# Patient Record
Sex: Male | Born: 1990 | Race: White | Hispanic: No | Marital: Single | Smoking: Former smoker
Health system: Southern US, Community
[De-identification: ages and names within clinical notes are randomized; demographics above are authoritative.]

## PROBLEM LIST (undated history)

## (undated) DIAGNOSIS — I2699 Other pulmonary embolism without acute cor pulmonale: Secondary | ICD-10-CM

## (undated) DIAGNOSIS — F988 Other specified behavioral and emotional disorders with onset usually occurring in childhood and adolescence: Secondary | ICD-10-CM

## (undated) HISTORY — PX: PULMONARY EMBOLISM SURGERY: SHX752

---
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
## 2009-05-07 ENCOUNTER — Emergency Department: Payer: Self-pay | Admitting: Emergency Medicine

## 2011-10-05 ENCOUNTER — Ambulatory Visit: Payer: Self-pay | Admitting: Family Medicine

## 2011-10-05 LAB — LIPID PANEL
Cholesterol: 84 mg/dL (ref 0–200)
Ldl Cholesterol, Calc: 23 mg/dL (ref 0–100)
Triglycerides: 132 mg/dL (ref 0–200)

## 2011-10-05 LAB — CBC WITH DIFFERENTIAL/PLATELET
Basophil %: 0.8 %
Eosinophil %: 3.2 %
HCT: 41.5 % (ref 40.0–52.0)
HGB: 14.4 g/dL (ref 13.0–18.0)
Lymphocyte %: 26.3 %
MCH: 30.5 pg (ref 26.0–34.0)
MCHC: 34.6 g/dL (ref 32.0–36.0)
MCV: 88 fL (ref 80–100)
Monocyte #: 0.7 x10 3/mm (ref 0.2–1.0)
Neutrophil %: 61.4 %
Platelet: 210 10*3/uL (ref 150–440)
RBC: 4.7 10*6/uL (ref 4.40–5.90)
RDW: 12.9 % (ref 11.5–14.5)

## 2011-10-05 LAB — COMPREHENSIVE METABOLIC PANEL
Albumin: 3.8 g/dL (ref 3.4–5.0)
Alkaline Phosphatase: 108 U/L (ref 50–136)
Anion Gap: 9 (ref 7–16)
BUN: 20 mg/dL — ABNORMAL HIGH (ref 7–18)
Bilirubin,Total: 0.2 mg/dL (ref 0.2–1.0)
Co2: 27 mmol/L (ref 21–32)
Creatinine: 0.97 mg/dL (ref 0.60–1.30)
EGFR (Non-African Amer.): >60
Glucose: 95 mg/dL (ref 65–99)
Osmolality: 280 (ref 275–301)
Potassium: 3.7 mmol/L (ref 3.5–5.1)
SGPT (ALT): 53 U/L (ref 12–78)

## 2011-10-05 LAB — TSH: Thyroid Stimulating Horm: 2.85 u[IU]/mL

## 2012-01-17 ENCOUNTER — Ambulatory Visit: Payer: Self-pay | Admitting: Family Medicine

## 2012-11-28 ENCOUNTER — Ambulatory Visit: Payer: Self-pay | Admitting: Family Medicine

## 2013-02-09 ENCOUNTER — Ambulatory Visit: Payer: Self-pay | Admitting: Physician Assistant

## 2013-03-22 ENCOUNTER — Ambulatory Visit: Payer: Self-pay | Admitting: Family Medicine

## 2014-05-06 ENCOUNTER — Ambulatory Visit: Payer: Self-pay | Admitting: Family Medicine

## 2015-08-26 ENCOUNTER — Ambulatory Visit
Admission: EM | Admit: 2015-08-26 | Discharge: 2015-08-26 | Disposition: A | Discharge status: Home / Self Care | Payer: BLUE CROSS/BLUE SHIELD | Attending: Family Medicine | Admitting: Family Medicine

## 2015-08-26 ENCOUNTER — Encounter: Payer: Self-pay | Admitting: *Deleted

## 2015-08-26 DIAGNOSIS — J039 Acute tonsillitis, unspecified: Secondary | ICD-10-CM | POA: Diagnosis not present

## 2015-08-26 HISTORY — DX: Other specified behavioral and emotional disorders with onset usually occurring in childhood and adolescence: F98.8

## 2015-08-26 HISTORY — DX: Other pulmonary embolism without acute cor pulmonale: I26.99

## 2015-08-26 MED ORDER — AMOXICILLIN 500 MG PO CAPS: 500.0000 mg | ORAL_CAPSULE | Freq: Two times a day (BID) | ORAL | Status: DC

## 2015-08-26 NOTE — ED Notes (Signed)
Patient has had symptom of sore throat and nasal congestion for 5 days. No other symptoms reported.

## 2015-08-26 NOTE — ED Provider Notes (Signed)
CSN: [id number]     Arrival date & time 08/26/15  1324 History   First MD Initiated Contact with Patient 08/26/15 1355     Chief Complaint  Patient presents with  . Sore Throat   (Consider location/radiation/quality/duration/timing/severity/associated sxs/prior Treatment) HPI: She presents today with symptoms of sore throat and nasal congestion for the last few days. Patient denies fever. Patient is on xaralto for history of PE. He states he often gets tonsillitis which this feels like. He thinks he has had mono in the past. He denies any abdominal pain, chest pain, shortness of breath.  Past Medical History  Diagnosis Date  . ADD (attention deficit disorder)   . PE (pulmonary embolism)    Past Surgical History  Procedure Laterality Date  . Pulmonary embolism surgery     Family History  Problem Relation Age of Onset  . Melanoma Mother   . Melanoma Father    Social History  Substance Use Topics  . Smoking status: Former Games developermoker  . Smokeless tobacco: Never Used  . Alcohol Use: No    Review of Systems: Negative except mentioned above.  Allergies  Molds & smuts and Sulfa antibiotics  Home Medications   Prior to Admission medications   Medication Sig Start Date End Date Taking? Authorizing Provider  amphetamine-dextroamphetamine (ADDERALL XR) 30 MG 24 hr capsule Take 30 mg by mouth daily.   Yes Historical Provider, MD  rivaroxaban (XARELTO) 20 MG TABS tablet Take 20 mg by mouth daily with supper.   Yes Historical Provider, MD  amoxicillin (AMOXIL) 500 MG capsule Take 1 capsule (500 mg total) by mouth 2 (two) times daily. 08/26/15   Jolene ProvostKirtida Fleta Borgeson, MD   Meds Ordered and Administered this Visit  Medications - No data to display  BP 148/76 mmHg  Pulse 82  Temp(Src) 98.7 F (37.1 C) (Oral)  Resp 18  Ht 6' (1.829 m)  Wt 290 lb (131.543 kg)  BMI 39.32 kg/m2  SpO2 97% No data found.   Physical Exam   GENERAL: NAD HEENT: moderate pharyngeal erythema with bilateral  tonsillar enlargement, no exudate, no erythema of TMs, no significant cervical LAD RESP: CTA B CARD: RRR ABD: +BS, NT, no organomegly appreciated NEURO: CN II-XII grossly intact   ED Course  Procedures (including critical care time)  Labs Review Labs Reviewed - No data to display  Imaging Review No results found.    MDM   1. Tonsillitis   Patient treated with Amoxicillin, rest, hydration, Tylenol when necessary, seek medical attention if symptoms persist or worsen as discussed. Work excuse given for today.    Jolene ProvostKirtida Prudy Candy, MD 08/26/15 [phone]

## 2016-03-04 IMAGING — US US EXTREM LOW VENOUS*L*
1 series · 13 of 24 positions shown · non-contrast
Comparison: None.

CLINICAL DATA: Acute left leg pain for 2 weeks.



[Series 1: us extrem low venous*left* · 0.09mm/px · 13 of 39 slices shown]
[im 1/39]
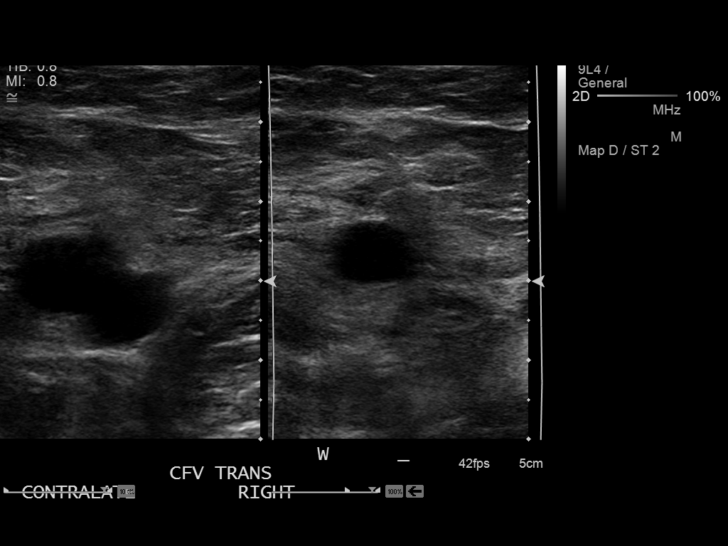
[im 4/39]
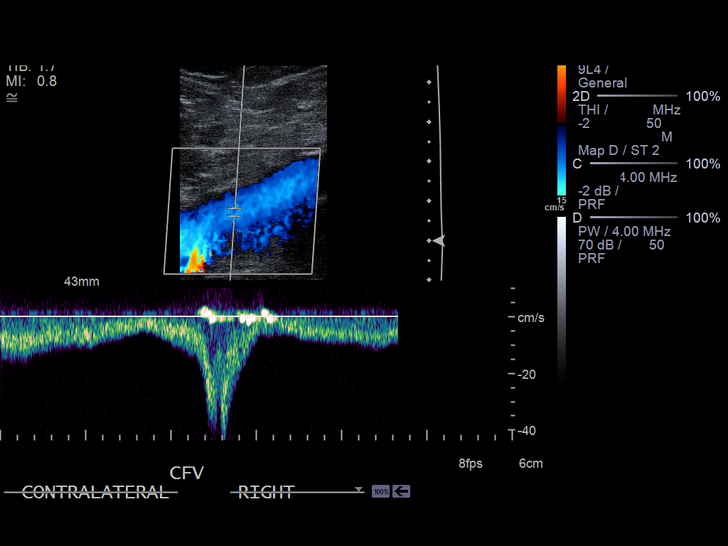
[im 7/39]
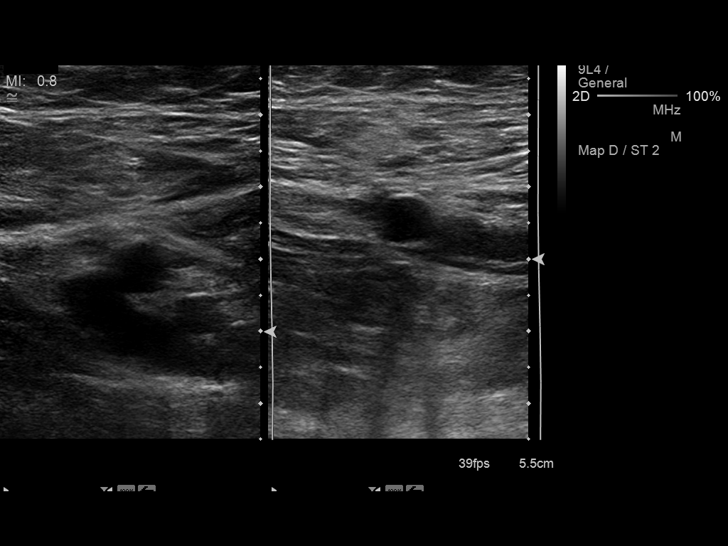
[im 10/39]
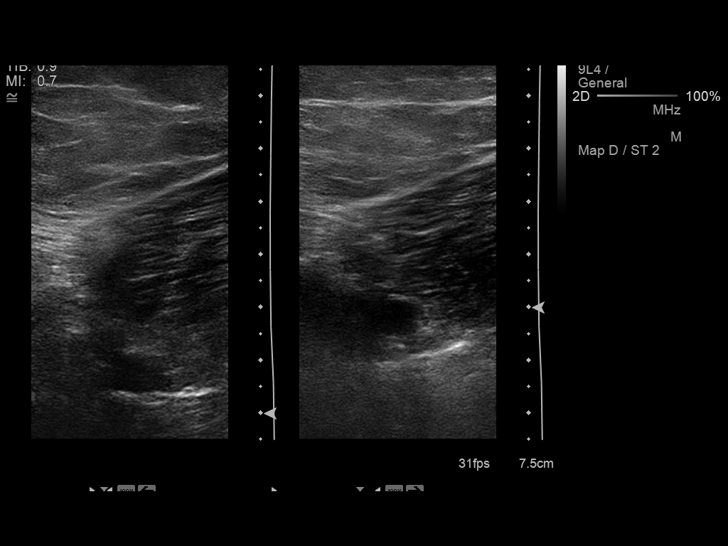
[im 14/39]
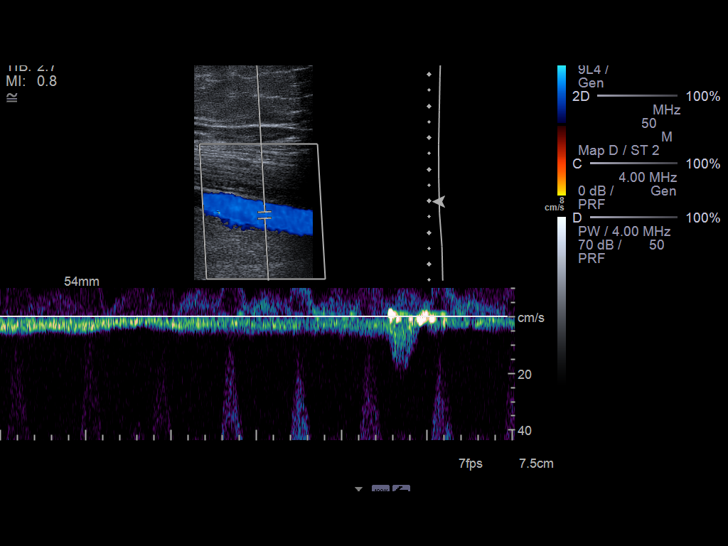
[im 17/39]
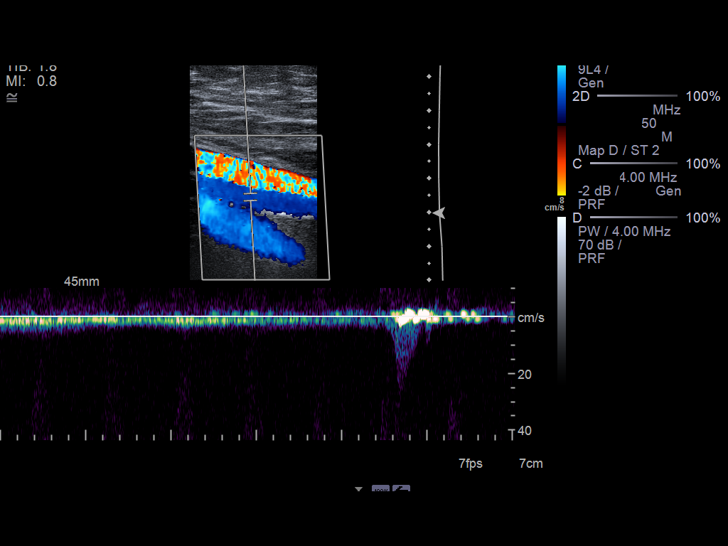
[im 20/39]
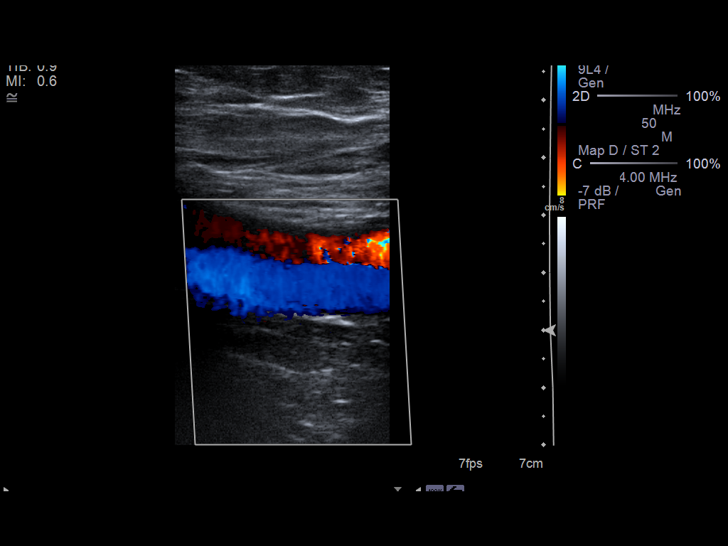
[im 22/39]
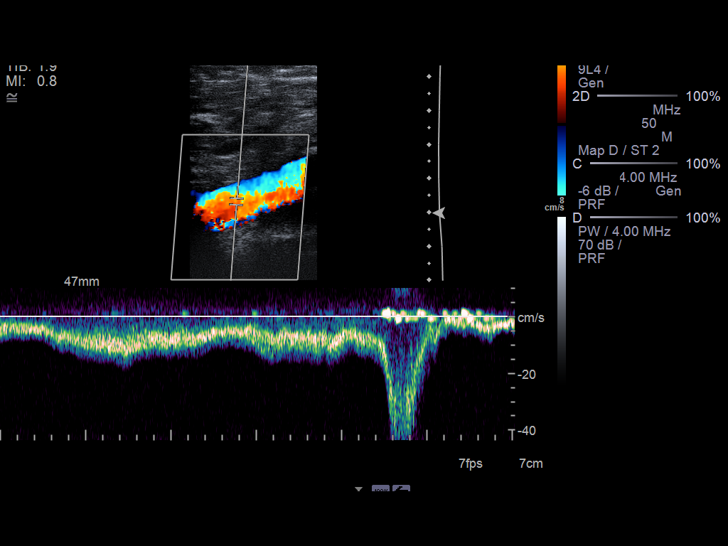
[im 25/39]
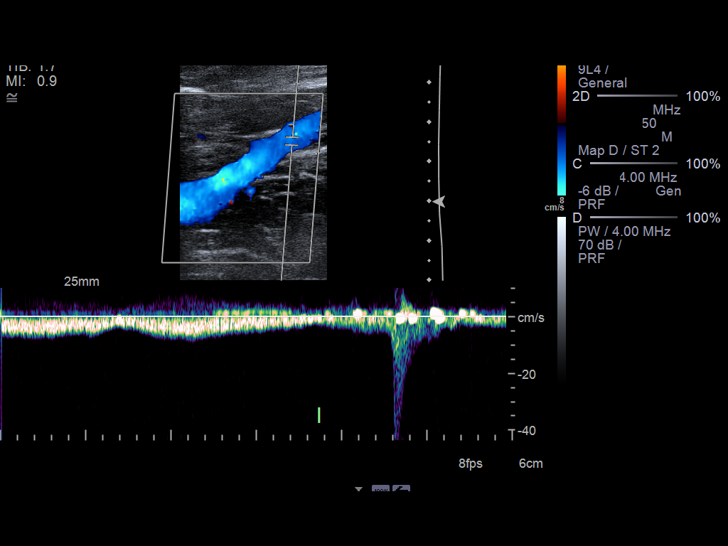
[im 29/39]
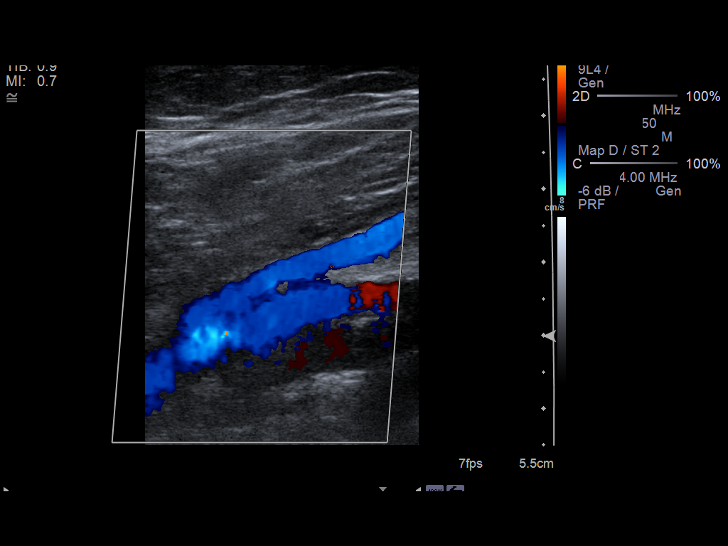
[im 32/39]
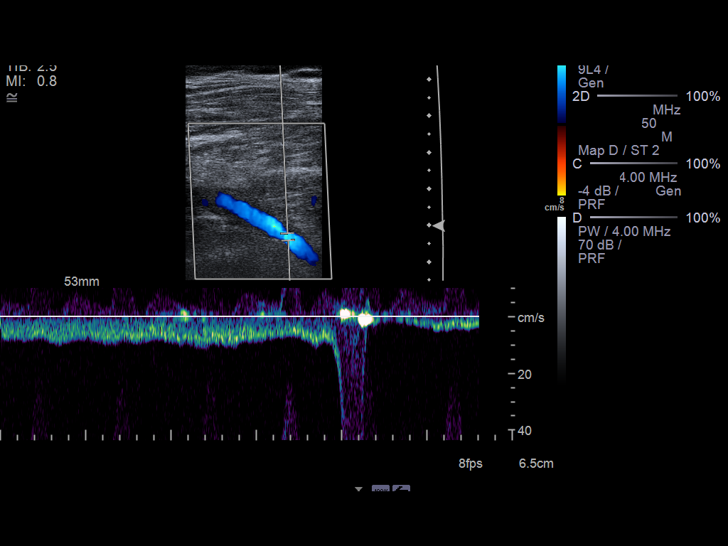
[im 35/39]
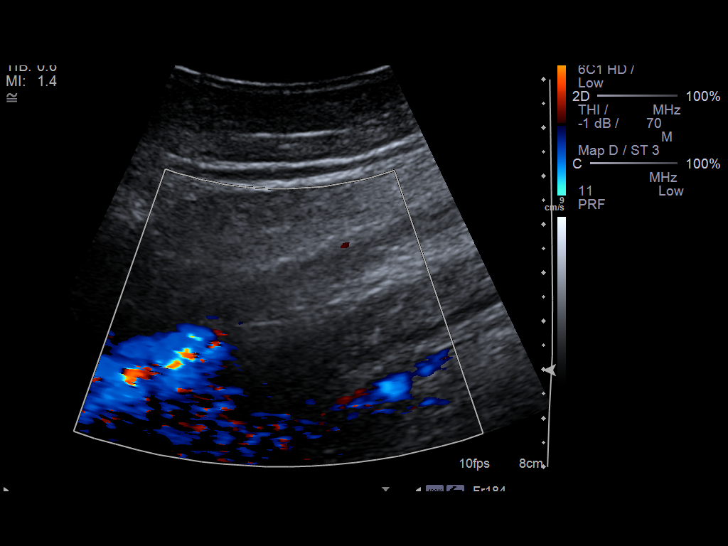
[im 39/39]
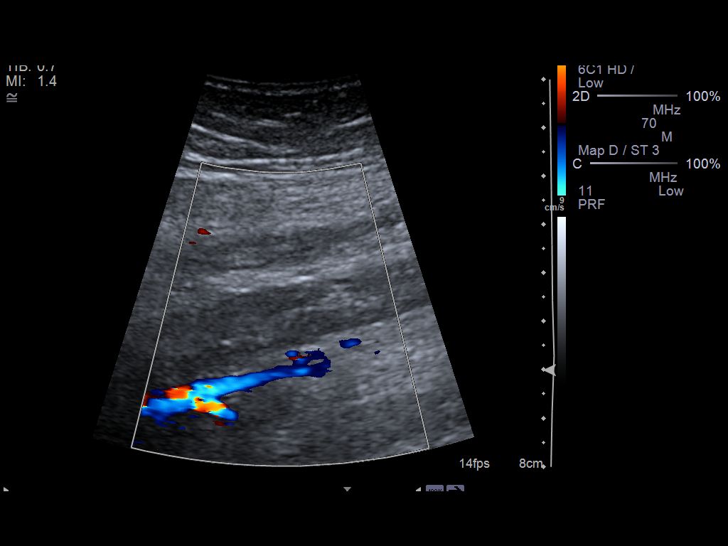

[13 of 24 positions shown; findings below may reference images not displayed]

FINDINGS: Contralateral Common Femoral Vein: Respiratory phasicity is normal
and symmetric with the symptomatic side. No evidence of thrombus.
Normal compressibility.

Common Femoral Vein: No evidence of thrombus. Normal
compressibility, respiratory phasicity and response to augmentation.

Saphenofemoral Junction: No evidence of thrombus. Normal
compressibility and flow on color Doppler imaging.

Profunda Femoral Vein: No evidence of thrombus. Normal
compressibility and flow on color Doppler imaging.

Femoral Vein: No evidence of thrombus. Normal compressibility,
respiratory phasicity and response to augmentation.

Popliteal Vein: No evidence of thrombus. Normal compressibility,
respiratory phasicity and response to augmentation.

Calf Veins: No evidence of thrombus. Normal compressibility and flow
on color Doppler imaging.

Superficial Great Saphenous Vein: No evidence of thrombus. Normal
compressibility and flow on color Doppler imaging.

Venous Reflux:  None.

Other Findings:  None.
IMPRESSION: No evidence of deep venous thrombosis.

## 2018-03-27 ENCOUNTER — Encounter: Payer: Self-pay | Admitting: Emergency Medicine

## 2018-03-27 ENCOUNTER — Ambulatory Visit
Admission: EM | Admit: 2018-03-27 | Discharge: 2018-03-27 | Disposition: A | Discharge status: Home / Self Care | Payer: BLUE CROSS/BLUE SHIELD | Attending: Family Medicine | Admitting: Family Medicine

## 2018-03-27 ENCOUNTER — Other Ambulatory Visit: Payer: Self-pay

## 2018-03-27 DIAGNOSIS — L03011 Cellulitis of right finger: Secondary | ICD-10-CM

## 2018-03-27 MED ORDER — MUPIROCIN 2 % EX OINT: 1.0000 "application " | TOPICAL_OINTMENT | Freq: Three times a day (TID) | CUTANEOUS | 0 refills | Status: AC

## 2018-03-27 MED ORDER — DOXYCYCLINE HYCLATE 100 MG PO CAPS: 100.0000 mg | ORAL_CAPSULE | Freq: Two times a day (BID) | ORAL | 0 refills | Status: AC

## 2018-03-27 NOTE — Discharge Instructions (Addendum)
Wet a washcloth under the faucet and then place in the microwave oven for 10 to 15 seconds.  Place the cloth over the abscess leaving it there for 10 minutes.  Dry the area thoroughly and apply mupirocin ointment to all areas of hardness/redness.  Perform this 3-4 times each and every day until the abscess has resolved.  Be certain to take all of the doxycycline.  If the area appears worsening return to our clinic or go to your primary care physician

## 2018-03-27 NOTE — ED Triage Notes (Signed)
Patient c/o redness and swelling in his right 2nd finger that started on Friday.

## 2018-03-27 NOTE — ED Provider Notes (Signed)
[facility]    CSN: [id number] Arrival date & time: 03/27/18  1247     History   Chief Complaint Chief Complaint  Patient presents with  . Hand Pain    HPI Patrick Holland is a 28 y.o. male.   HPI  -year-old male presents with redness and swelling of his right second finger that started on Friday.  He has been using warm compresses/soaks and Neosporin.  He has however been trying to squeeze the pus out.  This is seemingly gotten worse over time.        Past Medical History:  Diagnosis Date  . ADD (attention deficit disorder)   . PE (pulmonary embolism)     There are no active problems to display for this patient.   Past Surgical History:  Procedure Laterality Date  . PULMONARY EMBOLISM SURGERY         Home Medications    Prior to Admission medications   Medication Sig Start Date End Date Taking? Authorizing Provider  amphetamine-dextroamphetamine (ADDERALL XR) 30 MG 24 hr capsule Take 30 mg by mouth daily.   Yes [provider]  rivaroxaban (XARELTO) 20 MG TABS tablet Take 20 mg by mouth daily with supper.   Yes [provider]  doxycycline (VIBRAMYCIN) 100 MG capsule Take 1 capsule (100 mg total) by mouth 2 (two) times daily. 03/27/18   Lutricia Feiloemer, Kimmora Risenhoover P, PA-C  mupirocin ointment (BACTROBAN) 2 % Apply 1 application topically 3 (three) times daily. 03/27/18   Lutricia Feiloemer, Takai Chiaramonte P, PA-C    Family History Family History  Problem Relation Age of Onset  . Melanoma Mother   . Melanoma Father     Social History Social History   Tobacco Use  . Smoking status: Former Games developermoker  . Smokeless tobacco: Never Used  Substance Use Topics  . Alcohol use: No  . Drug use: Yes    Types: Marijuana     Allergies   Molds & smuts and Sulfa antibiotics   Review of Systems Review of Systems  Constitutional: Positive for activity change. Negative for appetite change, chills, fatigue and fever.  Skin: Positive for color change and  wound.  All other systems reviewed and are negative.    Physical Exam Triage Vital Signs ED Triage Vitals  Enc Vitals Group     BP 03/27/18 1317 (!) 144/94     Pulse Rate 03/27/18 1317 90     Resp 03/27/18 1317 16     Temp 03/27/18 1317 98.7 F (37.1 C)     Temp Source 03/27/18 1317 Oral     SpO2 03/27/18 1317 99 %     Weight 03/27/18 1315 (!) 325 lb (147.4 kg)     Height 03/27/18 1315 6\' 1"  (1.854 m)     Head Circumference --      Peak Flow --      Pain Score 03/27/18 1315 6     Pain Loc --      Pain Edu? --      Excl. in GC? --    No data found.  Updated Vital Signs BP (!) 144/94 (BP Location: Left Arm)   Pulse 90   Temp 98.7 F (37.1 C) (Oral)   Resp 16   Ht 6\' 1"  (1.854 m)   Wt (!) 325 lb (147.4 kg)   SpO2 99%   BMI 42.88 kg/m   Visual Acuity Right Eye Distance:   Left Eye Distance:   Bilateral Distance:    Right Eye  Near:   Left Eye Near:    Bilateral Near:     Physical Exam Vitals signs and nursing note reviewed.  Constitutional:      General: He is not in acute distress.    Appearance: Normal appearance. He is obese. He is not ill-appearing, toxic-appearing or diaphoretic.  HENT:     Head: Normocephalic.     Nose: Nose normal.     Mouth/Throat:     Mouth: Mucous membranes are moist.  Musculoskeletal: Normal range of motion.        General: Swelling and tenderness present.     Comments: Paronychia of the next finger involving the radial nailbed and the nail.  There is a pocket of pus that appears superficial and it is fluctuant.  He has surrounding cellulitis.  Skin:    General: Skin is warm and dry.  Neurological:     General: No focal deficit present.     Mental Status: He is alert and oriented to person, place, and time.  Psychiatric:        Mood and Affect: Mood normal.        Behavior: Behavior normal.        Thought Content: Thought content normal.        Judgment: Judgment normal.      UC Treatments / Results  Labs (all labs  ordered are listed, but only abnormal results are displayed) Labs Reviewed - No data to display  EKG None  Radiology No results found.  Procedures  After explaining the procedure to the patient and he agreed, area was cleansed thoroughly with hibicleanse.  18-gauge needle was then utilized to gently scrape the skin at the nail bed. No Purulence was encountered.  This reason ethyl chloride was then utilized to numb the superficial skin and a small stab wound made into the apparent purulent pocket.  No purulence was expressed.  The needle was swept in the area to try to encounter any loculations but no purulence was expressed.  At this point the procedure was terminated.  Dressing was applied.  Medications Ordered in UC Medications - No data to display  Initial Impression / Assessment and Plan / UC Course  I have reviewed the triage vital signs and the nursing notes.  Pertinent labs & imaging results that were available during my care of the patient were reviewed by me and considered in my medical decision making (see chart for details).   Place patient on 3 times daily wet compresses followed with application of Bactroban ointment.  We will start him on doxycycline p.o. if the neck he points not draining he may return to our clinic for I&D.  Otherwise it hopefully will clear up on its own.  He was cautioned not to pick at hangnails any further.  No evidence of felon.     Final Clinical Impressions(s) / UC Diagnoses   Final diagnoses:  Paronychia of right index finger     Discharge Instructions     Wet a washcloth under the faucet and then place in the microwave oven for 10 to 15 seconds.  Place the cloth over the abscess leaving it there for 10 minutes.  Dry the area thoroughly and apply mupirocin ointment to all areas of hardness/redness.  Perform this 3-4 times each and every day until the abscess has resolved.  Be certain to take all of the doxycycline.  If the area appears  worsening return to our clinic or go to your primary care physician  ED Prescriptions    Medication Sig Dispense Auth. Provider   mupirocin ointment (BACTROBAN) 2 % Apply 1 application topically 3 (three) times daily. 22 g Ovid Curd P, PA-C   doxycycline (VIBRAMYCIN) 100 MG capsule Take 1 capsule (100 mg total) by mouth 2 (two) times daily. 14 capsule Lutricia Feil, PA-C     Controlled Substance Prescriptions [place] Controlled Substance Registry consulted? Not Applicable   Lutricia Feil, PA-C 03/27/18 2113
